# Patient Record
Sex: Male | Born: 1985 | Race: White | Hispanic: No | Marital: Single | Smoking: Never smoker
Health system: Southern US, Community
[De-identification: ages and names within clinical notes are randomized; demographics above are authoritative.]

## PROBLEM LIST (undated history)

## (undated) DIAGNOSIS — M26609 Unspecified temporomandibular joint disorder, unspecified side: Secondary | ICD-10-CM

## (undated) DIAGNOSIS — M5104 Intervertebral disc disorders with myelopathy, thoracic region: Secondary | ICD-10-CM

## (undated) DIAGNOSIS — J301 Allergic rhinitis due to pollen: Secondary | ICD-10-CM

## (undated) DIAGNOSIS — R51 Headache: Secondary | ICD-10-CM

## (undated) DIAGNOSIS — C6290 Malignant neoplasm of unspecified testis, unspecified whether descended or undescended: Secondary | ICD-10-CM

## (undated) HISTORY — PX: RETROPERITONEAL LYMPH NODE EXCISION: SUR551

## (undated) HISTORY — PX: ORCHIECTOMY: SHX2116

## (undated) HISTORY — PX: TONSILLECTOMY: SUR1361

## (undated) HISTORY — DX: Malignant neoplasm of unspecified testis, unspecified whether descended or undescended: C62.90

## (undated) HISTORY — PX: APPENDECTOMY: SHX54

## (undated) HISTORY — DX: Intervertebral disc disorders with myelopathy, thoracic region: M51.04

---

## 2011-02-08 DIAGNOSIS — M5104 Intervertebral disc disorders with myelopathy, thoracic region: Secondary | ICD-10-CM

## 2011-02-08 HISTORY — DX: Intervertebral disc disorders with myelopathy, thoracic region: M51.04

## 2011-05-09 NOTE — Pre-Procedure Instructions (Signed)
22 Matthew Whitaker  05/09/2011   Your procedure is scheduled on:  Monday April 8  Report to [facility] at 8:00 AM.  Call this number if you have problems the morning of surgery: [phone]   Remember:   Do not eat food:After Midnight.  May have clear liquids: up to 4 Hours before arrival.  Clear liquids include soda, tea, black coffee, apple or grape juice, broth.  Take these medicines the morning of surgery with A SIP OF WATER: Norco   Do not wear jewelry, make-up or nail polish.  Do not wear lotions, powders, or perfumes. You may wear deodorant.  Do not shave 48 hours prior to surgery.  Do not bring valuables to the hospital.  Contacts, dentures or bridgework may not be worn into surgery.  Leave suitcase in the car. After surgery it may be brought to your room.  For patients admitted to the hospital, checkout time is 11:00 AM the day of discharge.   Patients discharged the day of surgery will not be allowed to drive home.  Name and phone number of your driver: NA  Special Instructions: CHG Shower Use Special Wash: 1/2 bottle night before surgery and 1/2 bottle morning of surgery.   Please read over the following fact sheets that you were given: Pain Booklet, Coughing and Deep Breathing, Blood Transfusion Information and Surgical Site Infection Prevention

## 2011-05-10 ENCOUNTER — Encounter (HOSPITAL_COMMUNITY): Payer: Self-pay

## 2011-05-10 ENCOUNTER — Encounter (HOSPITAL_COMMUNITY)
Admission: RE | Admit: 2011-05-10 | Discharge: 2011-05-10 | Disposition: A | Discharge status: Home / Self Care | Payer: BC Managed Care – PPO | Source: Ambulatory Visit | Attending: Neurosurgery | Admitting: Neurosurgery

## 2011-05-10 HISTORY — DX: Unspecified temporomandibular joint disorder, unspecified side: M26.609

## 2011-05-10 HISTORY — DX: Allergic rhinitis due to pollen: J30.1

## 2011-05-10 LAB — BASIC METABOLIC PANEL
Calcium: 9.3 mg/dL (ref 8.4–10.5)
Creatinine, Ser: 1.23 mg/dL (ref 0.50–1.35)
GFR calc Af Amer: >90 mL/min (ref >90)
GFR calc non Af Amer: 82 mL/min — ABNORMAL LOW (ref >90)

## 2011-05-10 LAB — DIFFERENTIAL
Basophils Absolute: 0 10*3/uL (ref 0.0–0.1)
Basophils Relative: 0 % (ref 0–1)
Neutro Abs: 2.6 10*3/uL (ref 1.7–7.7)
Neutrophils Relative %: 50 % (ref 43–77)

## 2011-05-10 LAB — TYPE AND SCREEN: Antibody Screen: NEGATIVE

## 2011-05-10 LAB — SURGICAL PCR SCREEN: MRSA, PCR: NEGATIVE

## 2011-05-10 LAB — CBC
Hemoglobin: 15.1 g/dL (ref 13.0–17.0)
MCHC: 35.7 g/dL (ref 30.0–36.0)
RDW: 12.3 % (ref 11.5–15.5)
WBC: 5.1 10*3/uL (ref 4.0–10.5)

## 2011-05-16 ENCOUNTER — Inpatient Hospital Stay (HOSPITAL_COMMUNITY)
Admission: RE | Admit: 2011-05-16 | Discharge: 2011-05-17 | DRG: 758 | Disposition: A | Discharge status: Home / Self Care | Payer: BC Managed Care – PPO | Source: Ambulatory Visit | Attending: Neurosurgery | Admitting: Neurosurgery

## 2011-05-16 ENCOUNTER — Encounter (HOSPITAL_COMMUNITY)
Admission: RE | Disposition: A | Discharge status: Home / Self Care | Payer: Self-pay | Source: Ambulatory Visit | Attending: Neurosurgery

## 2011-05-16 ENCOUNTER — Ambulatory Visit (HOSPITAL_COMMUNITY): Payer: BC Managed Care – PPO | Admitting: Anesthesiology

## 2011-05-16 ENCOUNTER — Ambulatory Visit (HOSPITAL_COMMUNITY): Payer: BC Managed Care – PPO

## 2011-05-16 ENCOUNTER — Encounter (HOSPITAL_COMMUNITY): Payer: Self-pay | Admitting: *Deleted

## 2011-05-16 ENCOUNTER — Encounter (HOSPITAL_COMMUNITY): Payer: Self-pay | Admitting: Neurosurgery

## 2011-05-16 ENCOUNTER — Encounter (HOSPITAL_COMMUNITY): Payer: Self-pay | Admitting: Anesthesiology

## 2011-05-16 DIAGNOSIS — Z8547 Personal history of malignant neoplasm of testis: Secondary | ICD-10-CM

## 2011-05-16 DIAGNOSIS — M5124 Other intervertebral disc displacement, thoracic region: Secondary | ICD-10-CM | POA: Diagnosis present

## 2011-05-16 DIAGNOSIS — Z01812 Encounter for preprocedural laboratory examination: Secondary | ICD-10-CM

## 2011-05-16 DIAGNOSIS — M5104 Intervertebral disc disorders with myelopathy, thoracic region: Principal | ICD-10-CM | POA: Diagnosis present

## 2011-05-16 SURGERY — THORACIC DISCECTOMY
Anesthesia: General | Site: Back | Laterality: Left | Wound class: Clean

## 2011-05-16 MED ORDER — SODIUM CHLORIDE 0.9 % IV SOLN
250.0000 mL | INTRAVENOUS | Status: DC
  Administered 2011-05-16: 250 mL via INTRAVENOUS

## 2011-05-16 MED ORDER — SODIUM CHLORIDE 0.9 % IV SOLN
INTRAVENOUS | Status: AC
  Filled 2011-05-16: qty 500

## 2011-05-16 MED ORDER — ROCURONIUM BROMIDE 100 MG/10ML IV SOLN
INTRAVENOUS | Status: DC | PRN
  Administered 2011-05-16: 5 mg via INTRAVENOUS
  Administered 2011-05-16: 20 mg via INTRAVENOUS
  Administered 2011-05-16 (×2): 5 mg via INTRAVENOUS
  Administered 2011-05-16: 10 mg via INTRAVENOUS
  Administered 2011-05-16 (×2): 5 mg via INTRAVENOUS
  Administered 2011-05-16: 50 mg via INTRAVENOUS
  Administered 2011-05-16 (×2): 5 mg via INTRAVENOUS

## 2011-05-16 MED ORDER — ONDANSETRON HCL 4 MG/2ML IJ SOLN
4.0000 mg | INTRAMUSCULAR | Status: DC | PRN
  Administered 2011-05-16: 4 mg via INTRAVENOUS
  Filled 2011-05-16: qty 2

## 2011-05-16 MED ORDER — HYDROCODONE-ACETAMINOPHEN 5-325 MG PO TABS
1.0000 | ORAL_TABLET | ORAL | Status: DC | PRN
  Administered 2011-05-17 (×2): 2 via ORAL
  Filled 2011-05-16 (×2): qty 2

## 2011-05-16 MED ORDER — HYDROMORPHONE HCL PF 1 MG/ML IJ SOLN
INTRAMUSCULAR | Status: AC
  Filled 2011-05-16: qty 1

## 2011-05-16 MED ORDER — LACTATED RINGERS IV SOLN
INTRAVENOUS | Status: DC | PRN
  Administered 2011-05-16 (×2): via INTRAVENOUS

## 2011-05-16 MED ORDER — BISACODYL 10 MG RE SUPP: 10.0000 mg | Freq: Every day | RECTAL | Status: DC | PRN

## 2011-05-16 MED ORDER — GLYCOPYRROLATE 0.2 MG/ML IJ SOLN
INTRAMUSCULAR | Status: DC | PRN
  Administered 2011-05-16: .8 mg via INTRAVENOUS

## 2011-05-16 MED ORDER — NEOSTIGMINE METHYLSULFATE 1 MG/ML IJ SOLN
INTRAMUSCULAR | Status: DC | PRN
  Administered 2011-05-16: 4 mg via INTRAVENOUS

## 2011-05-16 MED ORDER — VANCOMYCIN HCL 1000 MG IV SOLR
1250.0000 mg | Freq: Two times a day (BID) | INTRAVENOUS | Status: DC
  Administered 2011-05-17: 1250 mg via INTRAVENOUS
  Filled 2011-05-16 (×2): qty 1250

## 2011-05-16 MED ORDER — KETOROLAC TROMETHAMINE 30 MG/ML IJ SOLN
30.0000 mg | Freq: Four times a day (QID) | INTRAMUSCULAR | Status: DC
  Administered 2011-05-16 – 2011-05-17 (×3): 30 mg via INTRAVENOUS
  Filled 2011-05-16 (×7): qty 1

## 2011-05-16 MED ORDER — DEXAMETHASONE SODIUM PHOSPHATE 10 MG/ML IJ SOLN
INTRAMUSCULAR | Status: AC
  Administered 2011-05-16: 10 mg via INTRAVENOUS
  Filled 2011-05-16: qty 1

## 2011-05-16 MED ORDER — VANCOMYCIN HCL 500 MG IV SOLR: 500.0000 mg | INTRAVENOUS | Status: DC

## 2011-05-16 MED ORDER — MENTHOL 3 MG MT LOZG: 1.0000 | LOZENGE | OROMUCOSAL | Status: DC | PRN

## 2011-05-16 MED ORDER — HETASTARCH-ELECTROLYTES 6 % IV SOLN
INTRAVENOUS | Status: DC | PRN
  Administered 2011-05-16: 13:00:00 via INTRAVENOUS

## 2011-05-16 MED ORDER — SENNA 8.6 MG PO TABS
1.0000 | ORAL_TABLET | Freq: Two times a day (BID) | ORAL | Status: DC
  Administered 2011-05-16 – 2011-05-17 (×2): 8.6 mg via ORAL
  Filled 2011-05-16 (×3): qty 1

## 2011-05-16 MED ORDER — ACETAMINOPHEN 650 MG RE SUPP: 650.0000 mg | RECTAL | Status: DC | PRN

## 2011-05-16 MED ORDER — VANCOMYCIN HCL 1000 MG IV SOLR
1500.0000 mg | Freq: Once | INTRAVENOUS | Status: AC
  Administered 2011-05-16: 1500 mg via INTRAVENOUS
  Filled 2011-05-16: qty 1500

## 2011-05-16 MED ORDER — POLYETHYLENE GLYCOL 3350 17 G PO PACK
17.0000 g | PACK | Freq: Every day | ORAL | Status: DC | PRN
  Filled 2011-05-16: qty 1

## 2011-05-16 MED ORDER — MIDAZOLAM HCL 5 MG/5ML IJ SOLN
INTRAMUSCULAR | Status: DC | PRN
  Administered 2011-05-16: 2 mg via INTRAVENOUS

## 2011-05-16 MED ORDER — HYDROMORPHONE HCL PF 1 MG/ML IJ SOLN
0.5000 mg | INTRAMUSCULAR | Status: DC | PRN
Start: 2011-05-16 — End: 2011-05-17
  Administered 2011-05-16 – 2011-05-17 (×5): 1 mg via INTRAVENOUS
  Filled 2011-05-16 (×5): qty 1

## 2011-05-16 MED ORDER — SODIUM CHLORIDE 0.9 % IJ SOLN
3.0000 mL | Freq: Two times a day (BID) | INTRAMUSCULAR | Status: DC
  Administered 2011-05-17: 3 mL via INTRAVENOUS

## 2011-05-16 MED ORDER — DEXAMETHASONE SODIUM PHOSPHATE 10 MG/ML IJ SOLN: 10.0000 mg | Freq: Once | INTRAMUSCULAR | Status: DC

## 2011-05-16 MED ORDER — ZOLPIDEM TARTRATE 5 MG PO TABS: 10.0000 mg | ORAL_TABLET | Freq: Every evening | ORAL | Status: DC | PRN

## 2011-05-16 MED ORDER — PROPOFOL 10 MG/ML IV EMUL
INTRAVENOUS | Status: DC | PRN
  Administered 2011-05-16: 300 mg via INTRAVENOUS

## 2011-05-16 MED ORDER — PHENOL 1.4 % MT LIQD: 1.0000 | OROMUCOSAL | Status: DC | PRN

## 2011-05-16 MED ORDER — SODIUM CHLORIDE 0.9 % IV SOLN
INTRAVENOUS | Status: DC | PRN
  Administered 2011-05-16: 11:00:00 via INTRAVENOUS

## 2011-05-16 MED ORDER — ALUM & MAG HYDROXIDE-SIMETH 200-200-20 MG/5ML PO SUSP: 30.0000 mL | Freq: Four times a day (QID) | ORAL | Status: DC | PRN

## 2011-05-16 MED ORDER — HYDROMORPHONE HCL PF 1 MG/ML IJ SOLN
0.2500 mg | INTRAMUSCULAR | Status: DC | PRN
  Administered 2011-05-16 (×4): 0.5 mg via INTRAVENOUS

## 2011-05-16 MED ORDER — KETOROLAC TROMETHAMINE 30 MG/ML IJ SOLN
INTRAMUSCULAR | Status: DC | PRN
  Administered 2011-05-16: 30 mg via INTRAVENOUS

## 2011-05-16 MED ORDER — PHENYLEPHRINE HCL 10 MG/ML IJ SOLN
INTRAMUSCULAR | Status: DC | PRN
  Administered 2011-05-16: 80 ug via INTRAVENOUS

## 2011-05-16 MED ORDER — OXYCODONE-ACETAMINOPHEN 5-325 MG PO TABS
1.0000 | ORAL_TABLET | ORAL | Status: DC | PRN
  Administered 2011-05-16 – 2011-05-17 (×4): 2 via ORAL
  Filled 2011-05-16 (×4): qty 2

## 2011-05-16 MED ORDER — FLEET ENEMA 7-19 GM/118ML RE ENEM
1.0000 | ENEMA | Freq: Once | RECTAL | Status: AC | PRN
  Filled 2011-05-16: qty 1

## 2011-05-16 MED ORDER — VANCOMYCIN HCL IN DEXTROSE 1-5 GM/200ML-% IV SOLN
1000.0000 mg | Freq: Once | INTRAVENOUS | Status: AC
  Administered 2011-05-16: 1000 mg via INTRAVENOUS
  Filled 2011-05-16: qty 200

## 2011-05-16 MED ORDER — 0.9 % SODIUM CHLORIDE (POUR BTL) OPTIME
TOPICAL | Status: DC | PRN
  Administered 2011-05-16: 1000 mL

## 2011-05-16 MED ORDER — ACETAMINOPHEN 325 MG PO TABS
650.0000 mg | ORAL_TABLET | ORAL | Status: DC | PRN
  Administered 2011-05-17: 650 mg via ORAL
  Filled 2011-05-16: qty 2

## 2011-05-16 MED ORDER — DIAZEPAM 5 MG PO TABS
5.0000 mg | ORAL_TABLET | Freq: Four times a day (QID) | ORAL | Status: DC | PRN
  Administered 2011-05-16: 5 mg via ORAL
  Administered 2011-05-17 (×3): 10 mg via ORAL
  Filled 2011-05-16 (×2): qty 2
  Filled 2011-05-16: qty 1
  Filled 2011-05-16: qty 2

## 2011-05-16 MED ORDER — ONDANSETRON HCL 4 MG/2ML IJ SOLN
INTRAMUSCULAR | Status: DC | PRN
  Administered 2011-05-16: 4 mg via INTRAVENOUS

## 2011-05-16 MED ORDER — FENTANYL CITRATE 0.05 MG/ML IJ SOLN
INTRAMUSCULAR | Status: DC | PRN
  Administered 2011-05-16 (×2): 100 ug via INTRAVENOUS
  Administered 2011-05-16 (×3): 50 ug via INTRAVENOUS
  Administered 2011-05-16: 100 ug via INTRAVENOUS
  Administered 2011-05-16: 50 ug via INTRAVENOUS
  Administered 2011-05-16: 25 ug via INTRAVENOUS
  Administered 2011-05-16 (×3): 50 ug via INTRAVENOUS
  Administered 2011-05-16: 25 ug via INTRAVENOUS
  Administered 2011-05-16: 50 ug via INTRAVENOUS

## 2011-05-16 MED ORDER — SODIUM CHLORIDE 0.9 % IR SOLN
Status: DC | PRN
  Administered 2011-05-16: 11:00:00

## 2011-05-16 MED ORDER — BACITRACIN 50000 UNITS IM SOLR
INTRAMUSCULAR | Status: AC
  Filled 2011-05-16: qty 1

## 2011-05-16 MED ORDER — BUPIVACAINE HCL (PF) 0.25 % IJ SOLN
INTRAMUSCULAR | Status: DC | PRN
  Administered 2011-05-16: 30 mL

## 2011-05-16 MED ORDER — ONDANSETRON HCL 4 MG/2ML IJ SOLN: 4.0000 mg | Freq: Four times a day (QID) | INTRAMUSCULAR | Status: DC | PRN

## 2011-05-16 MED ORDER — THROMBIN 20000 UNITS EX KIT
PACK | CUTANEOUS | Status: DC | PRN
  Administered 2011-05-16: 11:00:00 via TOPICAL

## 2011-05-16 MED ORDER — SODIUM CHLORIDE 0.9 % IJ SOLN: 3.0000 mL | INTRAMUSCULAR | Status: DC | PRN

## 2011-05-16 SURGICAL SUPPLY — 55 items
BAG DECANTER FOR FLEXI CONT (MISCELLANEOUS) ×2 IMPLANT
BENZOIN TINCTURE PRP APPL 2/3 (GAUZE/BANDAGES/DRESSINGS) ×2 IMPLANT
BLADE SURG ROTATE 9660 (MISCELLANEOUS) IMPLANT
BRUSH SCRUB EZ PLAIN DRY (MISCELLANEOUS) ×2 IMPLANT
BUR CUTTER 7.0 ROUND (BURR) ×2 IMPLANT
BUR MATCHSTICK NEURO 3.0 LAGG (BURR) ×2 IMPLANT
CANISTER SUCTION 2500CC (MISCELLANEOUS) ×2 IMPLANT
CLOTH BEACON ORANGE TIMEOUT ST (SAFETY) ×2 IMPLANT
CONT SPEC 4OZ CLIKSEAL STRL BL (MISCELLANEOUS) ×2 IMPLANT
DECANTER SPIKE VIAL GLASS SM (MISCELLANEOUS) ×2 IMPLANT
DERMABOND ADVANCED (GAUZE/BANDAGES/DRESSINGS) ×2
DERMABOND ADVANCED .7 DNX12 (GAUZE/BANDAGES/DRESSINGS) ×2 IMPLANT
DRAPE LAPAROTOMY 100X72X124 (DRAPES) ×2 IMPLANT
DRAPE MICROSCOPE ZEISS OPMI (DRAPES) ×2 IMPLANT
DRAPE POUCH INSTRU U-SHP 10X18 (DRAPES) ×2 IMPLANT
DRAPE PROXIMA HALF (DRAPES) IMPLANT
DRAPE SURG 17X23 STRL (DRAPES) ×8 IMPLANT
ELECT REM PT RETURN 9FT ADLT (ELECTROSURGICAL) ×2
ELECTRODE REM PT RTRN 9FT ADLT (ELECTROSURGICAL) ×1 IMPLANT
EVACUATOR 1/8 PVC DRAIN (DRAIN) ×2 IMPLANT
GAUZE SPONGE 4X4 16PLY XRAY LF (GAUZE/BANDAGES/DRESSINGS) ×2 IMPLANT
GLOVE BIOGEL PI IND STRL 7.0 (GLOVE) ×2 IMPLANT
GLOVE BIOGEL PI IND STRL 7.5 (GLOVE) ×1 IMPLANT
GLOVE BIOGEL PI INDICATOR 7.0 (GLOVE) ×2
GLOVE BIOGEL PI INDICATOR 7.5 (GLOVE) ×1
GLOVE ECLIPSE 7.5 STRL STRAW (GLOVE) ×6 IMPLANT
GLOVE ECLIPSE 8.5 STRL (GLOVE) ×2 IMPLANT
GLOVE EXAM NITRILE LRG STRL (GLOVE) IMPLANT
GLOVE EXAM NITRILE MD LF STRL (GLOVE) ×2 IMPLANT
GLOVE EXAM NITRILE XL STR (GLOVE) IMPLANT
GLOVE EXAM NITRILE XS STR PU (GLOVE) IMPLANT
GLOVE SS BIOGEL STRL SZ 6.5 (GLOVE) ×3 IMPLANT
GLOVE SUPERSENSE BIOGEL SZ 6.5 (GLOVE) ×3
GLOVE SURG SS PI 6.5 STRL IVOR (GLOVE) ×2 IMPLANT
GOWN BRE IMP SLV AUR LG STRL (GOWN DISPOSABLE) ×2 IMPLANT
GOWN BRE IMP SLV AUR XL STRL (GOWN DISPOSABLE) ×6 IMPLANT
GOWN STRL REIN 2XL LVL4 (GOWN DISPOSABLE) IMPLANT
KIT BASIN OR (CUSTOM PROCEDURE TRAY) ×2 IMPLANT
KIT ROOM TURNOVER OR (KITS) ×2 IMPLANT
NEEDLE HYPO 22GX1.5 SAFETY (NEEDLE) ×2 IMPLANT
NEEDLE SPNL 22GX3.5 QUINCKE BK (NEEDLE) ×2 IMPLANT
NS IRRIG 1000ML POUR BTL (IV SOLUTION) ×2 IMPLANT
PACK LAMINECTOMY NEURO (CUSTOM PROCEDURE TRAY) ×2 IMPLANT
PAD ARMBOARD 7.5X6 YLW CONV (MISCELLANEOUS) ×6 IMPLANT
RUBBERBAND STERILE (MISCELLANEOUS) ×4 IMPLANT
SPONGE GAUZE 4X4 12PLY (GAUZE/BANDAGES/DRESSINGS) ×2 IMPLANT
SPONGE SURGIFOAM ABS GEL 100 (HEMOSTASIS) ×2 IMPLANT
SPONGE SURGIFOAM ABS GEL SZ50 (HEMOSTASIS) IMPLANT
STRIP CLOSURE SKIN 1/2X4 (GAUZE/BANDAGES/DRESSINGS) ×4 IMPLANT
SUT VIC AB 2-0 CT1 18 (SUTURE) ×6 IMPLANT
SUT VIC AB 3-0 SH 8-18 (SUTURE) ×4 IMPLANT
SYR 20ML ECCENTRIC (SYRINGE) ×2 IMPLANT
TOWEL OR 17X24 6PK STRL BLUE (TOWEL DISPOSABLE) ×2 IMPLANT
TOWEL OR 17X26 10 PK STRL BLUE (TOWEL DISPOSABLE) ×2 IMPLANT
WATER STERILE IRR 1000ML POUR (IV SOLUTION) ×2 IMPLANT

## 2011-05-16 NOTE — Anesthesia Postprocedure Evaluation (Signed)
Anesthesia Post Note  Patient: Matthew Whitaker  Procedure(s) Performed: Procedure(s) (LRB): THORACIC DISCECTOMY (Left)  Anesthesia type: General  Patient location: PACU  Post pain: Pain level controlled and Adequate analgesia  Post assessment: Post-op Vital signs reviewed, Patient's Cardiovascular Status Stable, Respiratory Function Stable, Patent Airway and Pain level controlled  Last Vitals:  Filed Vitals:   05/16/11 1500  BP:   Pulse: 103  Temp:   Resp: 4    Post vital signs: Reviewed and stable  Level of consciousness: awake, alert  and oriented  Complications: No apparent anesthesia complications

## 2011-05-16 NOTE — Op Note (Signed)
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  Postoperative diagnosis: Same  Procedure Name: Left T2-3 left T5-6 left T6-7 laminotomies and transpedicular microdiscectomy  Surgeon:Lillyan Hitson A.Danaria Larsen, M.D.  Asst. Surgeon: Colon Branch  Anesthesia: General  Indication: 26 year old male with intractable thoracic pain. Workup demonstrates evidence of a left-sided T2-3 left-sided T5-6 and left-sided C6-7 disc herniations with spinal cord compression. Patient presents now for laminotomies and transmitter microdiscectomy is in hopes of improving his symptoms.   Operative note: After induction of anesthesia, patient was positioned prone on the bolsters inappropriately padded. Patient's thoracic region was prepped and draped sterilely. 10 blade was was used to make an incision over with all to be the T6-7 interspace. This carried down sharply in the midline. Supper off Henderson Newcomer performed the left side so the lamina facet joints of T7 and T6. Deep self placed intraoperative x-rays taken and initially this was misinterpreted to be the T6-7 level but on re\re interpretation he was determined this is actually the T7-8 level. Dissection was then redirected 1 level cephalad. Laminotomies then performed is a high-speed drill Kerrison or significant inferior aspect of lamina of T6 the medial aspect of the facet joint at T6-7 and the superior aspect of lamina of T7. Pedicle of T7 was further removed using a high-speed drill. The space and incised 15 blade in her finger fashion. Casimiro Needle was brought field these were my dissection. A large amount of disc herniation was encountered this level. All this disc graciously resected. All loose are obvious he jammed his peers and removed and interspace. At this point a very thorough decompression had been achieved. There is no evidence of injury to the thecal  sac or nerve roots. Wound is then irrigated out solution. Gelfoam was placed topically for hemostasis which Ascent be good. Procedure then repeated in a similar fashion on the left side at T5-6 and on the left-sided T2-3. Following the discectomies at these levels the wounds were irrigated out solution. Gelfoam was placed topically for hemostasis and wounds were closed in a typical fashion using Vicryl sutures. The lower wound had a medium Hemovac drain placed. There were no apparent complications. Patient was well and he returns they're covering postop.

## 2011-05-16 NOTE — Anesthesia Preprocedure Evaluation (Signed)
Anesthesia Evaluation  Patient identified by MRN, date of birth, ID band Patient awake    Reviewed: Allergy & Precautions, H&P , NPO status , Patient's Chart, lab work & pertinent test results  Airway Mallampati: II  Neck ROM: full    Dental   Pulmonary          Cardiovascular     Neuro/Psych    GI/Hepatic   Endo/Other  obese  Renal/GU      Musculoskeletal   Abdominal   Peds  Hematology   Anesthesia Other Findings   Reproductive/Obstetrics                           Anesthesia Physical Anesthesia Plan  ASA: II  Anesthesia Plan: General   Post-op Pain Management:    Induction: Intravenous  Airway Management Planned: Oral ETT  Additional Equipment:   Intra-op Plan:   Post-operative Plan: Extubation in OR  Informed Consent: I have reviewed the patients History and Physical, chart, labs and discussed the procedure including the risks, benefits and alternatives for the proposed anesthesia with the patient or authorized representative who has indicated his/her understanding and acceptance.     Plan Discussed with: CRNA and Surgeon  Anesthesia Plan Comments:         Anesthesia Quick Evaluation  

## 2011-05-16 NOTE — Consult Note (Signed)
ANTIBIOTIC CONSULT NOTE - INITIAL  Pharmacy Consult for Vancomycin Indication: surgical prophylaxis  Allergies  Allergen Reactions  . Augmentin Other (See Comments)    Childhood reaction  . Penicillins Other (See Comments)    Childhood reaction     Patient Measurements: Weight: 134.4kg Height: 196cm  Vital Signs: Temp: 97.9 F (36.6 C) (04/08 1553) Temp src: Oral (04/08 0820) BP: 97/68 mmHg (04/08 1553) Pulse Rate: 120  (04/08 1553) Intake/Output from previous day:   Intake/Output from this shift: Total I/O In: 3700 [I.V.:3200; IV Piggyback:500] Out: 1150 [Urine:400; Blood:750]  Labs: CrCl 140ml/min  Microbiology: Recent Results (from the past 720 hour(s))  SURGICAL PCR SCREEN     Status: Normal   Collection Time   05/10/11 10:26 AM      Component Value Range Status Comment   MRSA, PCR NEGATIVE  NEGATIVE  Final    Staphylococcus aureus NEGATIVE  NEGATIVE  Final    Medical History: Past Medical History  Diagnosis Date  . Back pain, thoracic   . TMJ (temporomandibular joint syndrome)   . Hayfever   . Cancer     hx testicular CA   Medications:  Prescriptions prior to admission  Medication Sig Dispense Refill  . fexofenadine (ALLEGRA) 30 MG tablet Take 30 mg by mouth daily.      Marland Kitchen HYDROcodone-acetaminophen (NORCO) 10-325 MG per tablet Take 1 tablet by mouth every 6 (six) hours as needed. For pain      . ibuprofen (ADVIL,MOTRIN) 200 MG tablet Take 400 mg by mouth every 6 (six) hours as needed. For pain      . tiZANidine (ZANAFLEX) 4 MG capsule Take 2 mg by mouth 2 (two) times daily.       Assessment: 25yo obese male to begin Vancomycin for surgical prophylaxis s/p thoracic discectomy. Patient does have a hemovac drain. Pre-op Vancomycin 1500mg  given at 1030 today. Due to patient's weight, will dose using obesity nomogram.  Goal of Therapy:  Vancomycin trough level 10-15 mcg/ml  Plan:  1) Vancomycin 1000mg  x 1 today (to complete a total of 2500mg   today) 2)Then, Vancomycin 1250mg  q12 3) Follow renal function, troughs as indicated, LOT  Fredrik Rigger 05/16/2011,4:34 PM

## 2011-05-16 NOTE — Transfer of Care (Signed)
Immediate Anesthesia Transfer of Care Note  Patient: Matthew Whitaker  Procedure(s) Performed: Procedure(s) (LRB): THORACIC DISCECTOMY (Left)  Patient Location: PACU  Anesthesia Type: General  Level of Consciousness: awake and alert   Airway & Oxygen Therapy: Patient Spontanous Breathing and Patient connected to face mask oxygen  Post-op Assessment: Report given to PACU RN and Post -op Vital signs reviewed and stable  Post vital signs: Reviewed and stable  Complications: No apparent anesthesia complications

## 2011-05-16 NOTE — Preoperative (Signed)
Beta Blockers   Reason not to administer Beta Blockers:Not Applicable 

## 2011-05-16 NOTE — OR Nursing (Signed)
In and Out catheter done at end of procedure with 400cc dark yellow urine output.

## 2011-05-16 NOTE — Brief Op Note (Signed)
05/16/2011  2:31 PM  PATIENT:  Matthew Whitaker  26 y.o. male  PRE-OPERATIVE DIAGNOSIS:  HNP  POST-OPERATIVE DIAGNOSIS:  * No post-op diagnosis entered *  PROCEDURE:  Procedure(s) (LRB): THORACIC DISCECTOMY (Left)  SURGEON:  Surgeon(s) and Role:    * Temple Pacini, MD - Primary    * Clydene Fake, MD - Assisting  PHYSICIAN ASSISTANT:   ASSISTANTS: Colon Branch ANESTHESIA:   general  EBL:  Total I/O In: 3700 [I.V.:3200; IV Piggyback:500] Out: 1150 [Urine:400; Blood:750]  BLOOD ADMINISTERED:none  DRAINS: (Medium) Hemovact drain(s) in the Epidural space with  Suction Open   LOCAL MEDICATIONS USED:  MARCAINE     SPECIMEN:  No Specimen  DISPOSITION OF SPECIMEN:  N/A  COUNTS:  YES  TOURNIQUET:  * No tourniquets in log *  DICTATION: .Dragon Dictation  PLAN OF CARE: Admit to inpatient   PATIENT DISPOSITION:  PACU - hemodynamically stable.   Delay start of Pharmacological VTE agent (>24hrs) due to surgical blood loss or risk of bleeding: yes

## 2011-05-16 NOTE — H&P (Signed)
Matthew Whitaker is an 26 y.o. male.   Chief Complaint: Thoracic wall pain HPI: 26 year old male with severe left-sided dorsal chest pain with some radiation into his anterior chest wall with some associated numbness and paresthesias with some dysesthesia as well. Patient has no right-sided symptoms. No bowel or bladder dysfunction. No lower trimming function. He has failed all of his conservative management. Workup demonstrates evidence of a significant left-sided T23 disc herniation as well as a left-sided C6-7 disc herniation and to lesser degree left paracentral T5-6 disc herniation. All 3 lesions are felt to be symptomatic at all 3 lesions cause distortion of the thoracic spinal cord. Patient has been casted his options. He said to proceed with three-level thoracic transpedicular microdiscectomy is in hopes of improving his symptoms.  Past Medical History  Diagnosis Date  . Back pain, thoracic   . TMJ (temporomandibular joint syndrome)   . Hayfever   . Cancer     hx testicular CA    Past Surgical History  Procedure Date  . Tonsillectomy   . Orchiectomy     Right  . Retroperitoneal lymph node excision     R    History reviewed. No pertinent family history. Social History:  reports that he has never smoked. He does not have any smokeless tobacco history on file. He reports that he drinks alcohol. He reports that he does not use illicit drugs.  Allergies:  Allergies  Allergen Reactions  . Augmentin Other (See Comments)    Childhood reaction  . Penicillins Other (See Comments)    Childhood reaction     Medications Prior to Admission  Medication Dose Route Frequency Provider Last Rate Last Dose  . bacitracin 16109 UNITS injection           . dexamethasone (DECADRON) 10 MG/ML injection           . dexamethasone (DECADRON) injection 10 mg  10 mg Intravenous Once Temple Pacini, MD      . HYDROmorphone (DILAUDID) injection 0.25-0.5 mg  0.25-0.5 mg Intravenous Q5 min PRN  Raiford Simmonds, MD      . ondansetron (ZOFRAN) injection 4 mg  4 mg Intravenous Q6H PRN Raiford Simmonds, MD      . sodium chloride 0.9 % infusion           . vancomycin (VANCOCIN) 1,500 mg in sodium chloride 0.9 % 500 mL IVPB  1,500 mg Intravenous Once Herby Abraham, PHARMD      . DISCONTD: vancomycin (VANCOCIN) 500 mg in sodium chloride 0.9 % 100 mL IVPB  500 mg Intravenous 60 min Pre-Op Temple Pacini, MD       Medications Prior to Admission  Medication Sig Dispense Refill  . HYDROcodone-acetaminophen (NORCO) 10-325 MG per tablet Take 1 tablet by mouth every 6 (six) hours as needed. For pain      . ibuprofen (ADVIL,MOTRIN) 200 MG tablet Take 400 mg by mouth every 6 (six) hours as needed. For pain        No results found for this or any previous visit (from the past 48 hour(s)). No results found.  Review of Systems  Constitutional: Negative.   HENT: Negative.   Eyes: Negative.   Respiratory: Negative.   Cardiovascular: Negative.   Gastrointestinal: Negative.   Genitourinary: Negative.   Musculoskeletal: Negative.   Skin: Negative.   Neurological: Negative.   Endo/Heme/Allergies: Negative.   Psychiatric/Behavioral: Negative.     Blood pressure 132/83, pulse 89, temperature 98  F (36.7 C), temperature source Oral, resp. rate 20, SpO2 97.00%. Physical Exam  Constitutional: He is oriented to person, place, and time. He appears well-developed and well-nourished. No distress.  HENT:  Head: Normocephalic and atraumatic.  Right Ear: External ear normal.  Left Ear: External ear normal.  Nose: Nose normal.  Mouth/Throat: Oropharynx is clear and moist.  Eyes: Conjunctivae and EOM are normal. Pupils are equal, round, and reactive to light. Right eye exhibits no discharge. Left eye exhibits no discharge.  Neck: Normal range of motion. Neck supple. No tracheal deviation present. No thyromegaly present.  Cardiovascular: Normal rate, regular rhythm and intact distal pulses.     Respiratory: Effort normal and breath sounds normal. No stridor. No respiratory distress. He has no wheezes.  GI: Soft. Bowel sounds are normal. He exhibits no distension. There is no tenderness.  Musculoskeletal: Normal range of motion. He exhibits no edema and no tenderness.  Neurological: He is alert and oriented to person, place, and time. He has normal reflexes. He displays normal reflexes. No cranial nerve deficit. He exhibits normal muscle tone. Coordination normal.  Skin: Skin is warm and dry. No rash noted. He is not diaphoretic. No erythema. No pallor.  Psychiatric: He has a normal mood and affect. His behavior is normal. Judgment and thought content normal.     Assessment/Plan Left T2-3, left T5-6, left T6-7 herniated pulposus with myelopathy. Plan left T2-3, left T5-6, left T6-7 laminotomy and transpedicular microdiscectomy is. Risks and benefits have been explained. Patient wishes to proceed.  Kerron Sedano A 05/16/2011, 10:08 AM

## 2011-05-17 MED ORDER — OXYCODONE-ACETAMINOPHEN 5-325 MG PO TABS: 1.0000 | ORAL_TABLET | ORAL | Status: AC | PRN

## 2011-05-17 NOTE — Discharge Summary (Signed)
Physician Discharge Summary  Patient ID: IRIS TATSCH III MRN: [id number] DOB/AGE: November 10, 1985 25 y.o.  Admit date: 05/16/2011 Discharge date: 05/17/2011  Admission Diagnoses:  Discharge Diagnoses:  Principal Problem:  *Thoracic disc herniation   Discharged Condition: good  Hospital Course: Patient mid-off hospital where he underwent and multilevel thoracic transpedicular microdiscectomy is. Postoperatively 7 well. He is having no radiating pain. Having no lower chamois symptoms. Her sensation are intact to direct testing. Wound is healing well.  Consults:   Significant Diagnostic Studies:   Treatments:   Discharge Exam: Blood pressure 131/75, pulse 96, temperature 98.6 F (37 C), temperature source Oral, resp. rate 18, height 6' 5.17" (1.96 m), weight 134.4 kg (296 lb 4.8 oz), SpO2 96.00%. Awake and alert oriented and appropriate. Cranial nerve function is intact. Motor and sensory function extremities is normal. Wound healing well. Chest abdomen benign.  Disposition: Final discharge disposition not confirmed   Medication List  As of 05/17/2011 11:20 AM   TAKE these medications         fexofenadine 30 MG tablet   Commonly known as: ALLEGRA   Take 30 mg by mouth daily.      HYDROcodone-acetaminophen 10-325 MG per tablet   Commonly known as: NORCO   Take 1 tablet by mouth every 6 (six) hours as needed. For pain      ibuprofen 200 MG tablet   Commonly known as: ADVIL,MOTRIN   Take 400 mg by mouth every 6 (six) hours as needed. For pain      oxyCODONE-acetaminophen 5-325 MG per tablet   Commonly known as: PERCOCET   Take 1-2 tablets by mouth every 4 (four) hours as needed.      tiZANidine 4 MG capsule   Commonly known as: ZANAFLEX   Take 2 mg by mouth 2 (two) times daily.           Follow-up Information    Follow up with Geana Walts A, MD. Call in 1 week.   Contact information:   [address], [address] [place] [address] [phone]          Signed: Temple Pacini 05/17/2011, 11:20 AM

## 2011-05-17 NOTE — Discharge Instructions (Signed)
Wound Care °Keep incision covered and dry for one week.  If you shower prior to then, cover incision with plastic wrap.  °You may remove outer bandage after one week and shower.  °Do not put any creams, lotions, or ointments on incision. °Leave steri-strips on neck.  They will fall off by themselves. °Activity °Walk each and every day, increasing distance each day. °No lifting greater than 5 lbs.  Avoid excessive neck motion. °No driving for 2 weeks; may ride as a passenger locally. °If provided with back brace, wear when out of bed.  It is not necessary to wear brace in bed. °Diet °Resume your normal diet.  °Return to Work °Will be discussed at you follow up appointment. °Call Your Doctor If Any of These Occur °Redness, drainage, or swelling at the wound.  °Temperature greater than 101 degrees. °Severe pain not relieved by pain medication. °Incision starts to come apart. °Follow Up Appt °Call today for appointment in 1-2 weeks ([phone]) or for problems.  If you have any hardware placed in your spine, you will need an x-ray before your appointment. °

## 2011-12-27 ENCOUNTER — Other Ambulatory Visit: Payer: Self-pay | Admitting: Neurosurgery

## 2011-12-27 DIAGNOSIS — M4804 Spinal stenosis, thoracic region: Secondary | ICD-10-CM

## 2012-01-01 ENCOUNTER — Ambulatory Visit
Admission: RE | Admit: 2012-01-01 | Discharge: 2012-01-01 | Disposition: A | Discharge status: Home / Self Care | Payer: BC Managed Care – PPO | Source: Ambulatory Visit | Attending: Neurosurgery | Admitting: Neurosurgery

## 2012-01-01 DIAGNOSIS — M4804 Spinal stenosis, thoracic region: Secondary | ICD-10-CM

## 2012-01-01 MED ORDER — GADOBENATE DIMEGLUMINE 529 MG/ML IV SOLN
20.0000 mL | Freq: Once | INTRAVENOUS | Status: AC | PRN
  Administered 2012-01-01: 20 mL via INTRAVENOUS

## 2012-01-19 ENCOUNTER — Other Ambulatory Visit: Payer: Self-pay | Admitting: Neurosurgery

## 2012-01-20 ENCOUNTER — Encounter (HOSPITAL_COMMUNITY): Payer: Self-pay | Admitting: Pharmacy Technician

## 2012-01-25 ENCOUNTER — Encounter (HOSPITAL_COMMUNITY)
Admission: RE | Admit: 2012-01-25 | Discharge: 2012-01-25 | Disposition: A | Discharge status: Home / Self Care | Payer: BC Managed Care – PPO | Source: Ambulatory Visit | Attending: Neurosurgery | Admitting: Neurosurgery

## 2012-01-25 ENCOUNTER — Encounter (HOSPITAL_COMMUNITY): Payer: Self-pay

## 2012-01-25 HISTORY — DX: Headache: R51

## 2012-01-25 LAB — CBC WITH DIFFERENTIAL/PLATELET
Basophils Absolute: 0 10*3/uL (ref 0.0–0.1)
Basophils Relative: 1 % (ref 0–1)
Eosinophils Absolute: 0.1 10*3/uL (ref 0.0–0.7)
Eosinophils Relative: 1 % (ref 0–5)
Lymphocytes Relative: 36 % (ref 12–46)
MCHC: 34.7 g/dL (ref 30.0–36.0)
MCV: 87.1 fL (ref 78.0–100.0)
Monocytes Absolute: 0.5 10*3/uL (ref 0.1–1.0)
Platelets: 246 10*3/uL (ref 150–400)
RDW: 12.6 % (ref 11.5–15.5)
WBC: 6.6 10*3/uL (ref 4.0–10.5)

## 2012-01-25 LAB — TYPE AND SCREEN: ABO/RH(D): O POS

## 2012-01-25 NOTE — Pre-Procedure Instructions (Signed)
20 Si A Glaus III  01/25/2012   Your procedure is scheduled on:  Friday  January 27, 2012  Report to [facility] at 11:30 AM.  Call this number if you have problems the morning of surgery: [phone]   Remember:   Do not eat food or drink After Midnight.    Take these medicines the morning of surgery with A SIP OF WATER: none   Do not wear jewelry, make-up or nail polish.  Do not wear lotions, powders, or perfumes.  Do not shave 48 hours prior to surgery. Men may shave face and neck.  Do not bring valuables to the hospital.  Contacts, dentures or bridgework may not be worn into surgery.  Leave suitcase in the car. After surgery it may be brought to your room.  For patients admitted to the hospital, checkout time is 11:00 AM the day of discharge.   Patients discharged the day of surgery will not be allowed to drive home.  Name and phone number of your driver: family / friend  Special Instructions: Shower using CHG 2 nights before surgery and the night before surgery.  If you shower the day of surgery use CHG.  Use special wash - you have one bottle of CHG for all showers.  You should use approximately 1/3 of the bottle for each shower.   Please read over the following fact sheets that you were given: Pain Booklet, Coughing and Deep Breathing, Blood Transfusion Information, MRSA Information and Surgical Site Infection Prevention

## 2012-01-26 MED ORDER — VANCOMYCIN HCL 10 G IV SOLR
1500.0000 mg | INTRAVENOUS | Status: AC
  Administered 2012-01-27: 1500 mg via INTRAVENOUS
  Filled 2012-01-26: qty 1500

## 2012-01-27 ENCOUNTER — Ambulatory Visit (HOSPITAL_COMMUNITY): Payer: BC Managed Care – PPO | Admitting: Anesthesiology

## 2012-01-27 ENCOUNTER — Encounter (HOSPITAL_COMMUNITY): Payer: Self-pay | Admitting: Anesthesiology

## 2012-01-27 ENCOUNTER — Ambulatory Visit (HOSPITAL_COMMUNITY)
Admission: RE | Admit: 2012-01-27 | Discharge: 2012-01-28 | Disposition: A | Discharge status: Home / Self Care | Payer: BC Managed Care – PPO | Source: Ambulatory Visit | Attending: Neurosurgery | Admitting: Neurosurgery

## 2012-01-27 ENCOUNTER — Encounter (HOSPITAL_COMMUNITY): Payer: Self-pay | Admitting: *Deleted

## 2012-01-27 ENCOUNTER — Encounter (HOSPITAL_COMMUNITY)
Admission: RE | Disposition: A | Discharge status: Home / Self Care | Payer: Self-pay | Source: Ambulatory Visit | Attending: Neurosurgery

## 2012-01-27 ENCOUNTER — Ambulatory Visit (HOSPITAL_COMMUNITY): Payer: BC Managed Care – PPO

## 2012-01-27 DIAGNOSIS — Z23 Encounter for immunization: Secondary | ICD-10-CM | POA: Insufficient documentation

## 2012-01-27 DIAGNOSIS — Z01812 Encounter for preprocedural laboratory examination: Secondary | ICD-10-CM | POA: Insufficient documentation

## 2012-01-27 DIAGNOSIS — M5104 Intervertebral disc disorders with myelopathy, thoracic region: Secondary | ICD-10-CM | POA: Diagnosis present

## 2012-01-27 HISTORY — PX: THORACIC DISCECTOMY: SHX6113

## 2012-01-27 LAB — BASIC METABOLIC PANEL
BUN: 18 mg/dL (ref 6–23)
CO2: 26 mEq/L (ref 19–32)
Calcium: 9.3 mg/dL (ref 8.4–10.5)
Chloride: 104 mEq/L (ref 96–112)
Creatinine, Ser: 1.09 mg/dL (ref 0.50–1.35)
Glucose, Bld: 91 mg/dL (ref 70–99)

## 2012-01-27 SURGERY — THORACIC DISCECTOMY
Anesthesia: General | Site: Back | Laterality: Right | Wound class: Clean

## 2012-01-27 MED ORDER — OXYCODONE HCL 5 MG/5ML PO SOLN: 5.0000 mg | Freq: Once | ORAL | Status: DC | PRN

## 2012-01-27 MED ORDER — PROMETHAZINE HCL 25 MG/ML IJ SOLN
INTRAMUSCULAR | Status: AC
  Administered 2012-01-27: 6.25 mg via INTRAVENOUS
  Filled 2012-01-27: qty 1

## 2012-01-27 MED ORDER — ACETAMINOPHEN 325 MG PO TABS: 650.0000 mg | ORAL_TABLET | ORAL | Status: DC | PRN

## 2012-01-27 MED ORDER — HYDROMORPHONE HCL PF 1 MG/ML IJ SOLN
INTRAMUSCULAR | Status: AC
  Administered 2012-01-27: 0.5 mg via INTRAVENOUS
  Filled 2012-01-27: qty 1

## 2012-01-27 MED ORDER — GLYCOPYRROLATE 0.2 MG/ML IJ SOLN
INTRAMUSCULAR | Status: DC | PRN
  Administered 2012-01-27: 0.6 mg via INTRAVENOUS

## 2012-01-27 MED ORDER — MIDAZOLAM HCL 5 MG/5ML IJ SOLN
INTRAMUSCULAR | Status: DC | PRN
  Administered 2012-01-27: 2 mg via INTRAVENOUS

## 2012-01-27 MED ORDER — VANCOMYCIN HCL 10 G IV SOLR
1500.0000 mg | Freq: Once | INTRAVENOUS | Status: AC
  Administered 2012-01-28: 1500 mg via INTRAVENOUS
  Filled 2012-01-27: qty 1500

## 2012-01-27 MED ORDER — OXYCODONE HCL 5 MG PO TABS: 5.0000 mg | ORAL_TABLET | Freq: Once | ORAL | Status: DC | PRN

## 2012-01-27 MED ORDER — BACITRACIN 50000 UNITS IM SOLR
INTRAMUSCULAR | Status: AC
  Filled 2012-01-27: qty 1

## 2012-01-27 MED ORDER — SODIUM CHLORIDE 0.9 % IV SOLN
250.0000 mL | INTRAVENOUS | Status: DC
  Administered 2012-01-28: 250 mL via INTRAVENOUS

## 2012-01-27 MED ORDER — SODIUM CHLORIDE 0.9 % IV SOLN
INTRAVENOUS | Status: AC
  Filled 2012-01-27: qty 500

## 2012-01-27 MED ORDER — SODIUM CHLORIDE 0.9 % IJ SOLN
3.0000 mL | Freq: Two times a day (BID) | INTRAMUSCULAR | Status: DC
  Administered 2012-01-27: 3 mL via INTRAVENOUS

## 2012-01-27 MED ORDER — HYDROMORPHONE HCL PF 1 MG/ML IJ SOLN
0.2500 mg | INTRAMUSCULAR | Status: DC | PRN
  Administered 2012-01-27 (×2): 0.5 mg via INTRAVENOUS

## 2012-01-27 MED ORDER — 0.9 % SODIUM CHLORIDE (POUR BTL) OPTIME
TOPICAL | Status: DC | PRN
  Administered 2012-01-27: 1000 mL

## 2012-01-27 MED ORDER — ONDANSETRON HCL 4 MG/2ML IJ SOLN: 4.0000 mg | INTRAMUSCULAR | Status: DC | PRN

## 2012-01-27 MED ORDER — SENNA 8.6 MG PO TABS
1.0000 | ORAL_TABLET | Freq: Two times a day (BID) | ORAL | Status: DC
  Administered 2012-01-27 – 2012-01-28 (×2): 8.6 mg via ORAL
  Filled 2012-01-27 (×3): qty 1

## 2012-01-27 MED ORDER — PROPOFOL 10 MG/ML IV BOLUS
INTRAVENOUS | Status: DC | PRN
  Administered 2012-01-27: 200 mg via INTRAVENOUS

## 2012-01-27 MED ORDER — ACETAMINOPHEN 650 MG RE SUPP: 650.0000 mg | RECTAL | Status: DC | PRN

## 2012-01-27 MED ORDER — LIDOCAINE HCL 4 % MT SOLN
OROMUCOSAL | Status: DC | PRN
  Administered 2012-01-27: 4 mL via TOPICAL

## 2012-01-27 MED ORDER — THROMBIN 5000 UNITS EX KIT
PACK | CUTANEOUS | Status: DC | PRN
  Administered 2012-01-27 (×2): 5000 [IU] via TOPICAL

## 2012-01-27 MED ORDER — ONDANSETRON HCL 4 MG/2ML IJ SOLN
INTRAMUSCULAR | Status: DC | PRN
  Administered 2012-01-27: 4 mg via INTRAVENOUS

## 2012-01-27 MED ORDER — DEXAMETHASONE SODIUM PHOSPHATE 10 MG/ML IJ SOLN
INTRAMUSCULAR | Status: AC
  Filled 2012-01-27: qty 1

## 2012-01-27 MED ORDER — HYDROCODONE-ACETAMINOPHEN 5-325 MG PO TABS: 1.0000 | ORAL_TABLET | ORAL | Status: DC | PRN

## 2012-01-27 MED ORDER — ALUM & MAG HYDROXIDE-SIMETH 200-200-20 MG/5ML PO SUSP: 30.0000 mL | Freq: Four times a day (QID) | ORAL | Status: DC | PRN

## 2012-01-27 MED ORDER — BUPIVACAINE HCL (PF) 0.25 % IJ SOLN
INTRAMUSCULAR | Status: DC | PRN
  Administered 2012-01-27: 30 mL

## 2012-01-27 MED ORDER — NEOSTIGMINE METHYLSULFATE 1 MG/ML IJ SOLN
INTRAMUSCULAR | Status: DC | PRN
  Administered 2012-01-27: 4 mg via INTRAVENOUS

## 2012-01-27 MED ORDER — PHENOL 1.4 % MT LIQD: 1.0000 | OROMUCOSAL | Status: DC | PRN

## 2012-01-27 MED ORDER — ARTIFICIAL TEARS OP OINT
TOPICAL_OINTMENT | OPHTHALMIC | Status: DC | PRN
  Administered 2012-01-27: 1 via OPHTHALMIC

## 2012-01-27 MED ORDER — HYDROMORPHONE HCL PF 1 MG/ML IJ SOLN
0.5000 mg | INTRAMUSCULAR | Status: DC | PRN
  Administered 2012-01-27 – 2012-01-28 (×2): 1 mg via INTRAVENOUS
  Filled 2012-01-27 (×2): qty 1

## 2012-01-27 MED ORDER — KETOROLAC TROMETHAMINE 30 MG/ML IJ SOLN
INTRAMUSCULAR | Status: DC | PRN
  Administered 2012-01-27: 30 mg via INTRAVENOUS

## 2012-01-27 MED ORDER — PHENYLEPHRINE HCL 10 MG/ML IJ SOLN
INTRAMUSCULAR | Status: DC | PRN
  Administered 2012-01-27: 40 ug via INTRAVENOUS
  Administered 2012-01-27: 80 ug via INTRAVENOUS
  Administered 2012-01-27: 40 ug via INTRAVENOUS

## 2012-01-27 MED ORDER — ROCURONIUM BROMIDE 100 MG/10ML IV SOLN
INTRAVENOUS | Status: DC | PRN
  Administered 2012-01-27: 50 mg via INTRAVENOUS
  Administered 2012-01-27 (×2): 10 mg via INTRAVENOUS

## 2012-01-27 MED ORDER — HEMOSTATIC AGENTS (NO CHARGE) OPTIME
TOPICAL | Status: DC | PRN
  Administered 2012-01-27: 1 via TOPICAL

## 2012-01-27 MED ORDER — DEXAMETHASONE SODIUM PHOSPHATE 10 MG/ML IJ SOLN
10.0000 mg | INTRAMUSCULAR | Status: AC
  Administered 2012-01-27: 10 mg via INTRAVENOUS

## 2012-01-27 MED ORDER — HYDROMORPHONE HCL PF 1 MG/ML IJ SOLN
INTRAMUSCULAR | Status: DC | PRN
  Administered 2012-01-27 (×2): 0.5 mg via INTRAVENOUS

## 2012-01-27 MED ORDER — PROMETHAZINE HCL 25 MG/ML IJ SOLN
6.2500 mg | INTRAMUSCULAR | Status: DC | PRN
  Administered 2012-01-27: 6.25 mg via INTRAVENOUS

## 2012-01-27 MED ORDER — KETOROLAC TROMETHAMINE 30 MG/ML IJ SOLN
30.0000 mg | Freq: Four times a day (QID) | INTRAMUSCULAR | Status: DC
  Administered 2012-01-28 (×2): 30 mg via INTRAVENOUS
  Filled 2012-01-27 (×6): qty 1

## 2012-01-27 MED ORDER — SODIUM CHLORIDE 0.9 % IJ SOLN: 3.0000 mL | INTRAMUSCULAR | Status: DC | PRN

## 2012-01-27 MED ORDER — SODIUM CHLORIDE 0.9 % IR SOLN
Status: DC | PRN
  Administered 2012-01-27: 15:00:00

## 2012-01-27 MED ORDER — OXYCODONE-ACETAMINOPHEN 5-325 MG PO TABS
1.0000 | ORAL_TABLET | ORAL | Status: DC | PRN
  Administered 2012-01-27 – 2012-01-28 (×2): 2 via ORAL
  Filled 2012-01-27 (×2): qty 2

## 2012-01-27 MED ORDER — FENTANYL CITRATE 0.05 MG/ML IJ SOLN
INTRAMUSCULAR | Status: DC | PRN
  Administered 2012-01-27: 100 ug via INTRAVENOUS
  Administered 2012-01-27: 150 ug via INTRAVENOUS
  Administered 2012-01-27: 50 ug via INTRAVENOUS
  Administered 2012-01-27: 100 ug via INTRAVENOUS

## 2012-01-27 MED ORDER — DIAZEPAM 5 MG PO TABS
5.0000 mg | ORAL_TABLET | Freq: Four times a day (QID) | ORAL | Status: DC | PRN
  Administered 2012-01-28: 10 mg via ORAL
  Filled 2012-01-27: qty 2

## 2012-01-27 MED ORDER — MENTHOL 3 MG MT LOZG: 1.0000 | LOZENGE | OROMUCOSAL | Status: DC | PRN

## 2012-01-27 MED ORDER — LACTATED RINGERS IV SOLN
INTRAVENOUS | Status: DC | PRN
  Administered 2012-01-27 (×2): via INTRAVENOUS

## 2012-01-27 SURGICAL SUPPLY — 51 items
BAG DECANTER FOR FLEXI CONT (MISCELLANEOUS) ×2 IMPLANT
BENZOIN TINCTURE PRP APPL 2/3 (GAUZE/BANDAGES/DRESSINGS) ×2 IMPLANT
BLADE SURG ROTATE 9660 (MISCELLANEOUS) IMPLANT
BRUSH SCRUB EZ PLAIN DRY (MISCELLANEOUS) ×2 IMPLANT
BUR CUTTER 7.0 ROUND (BURR) ×2 IMPLANT
BUR MATCHSTICK NEURO 3.0 LAGG (BURR) ×2 IMPLANT
CANISTER SUCTION 2500CC (MISCELLANEOUS) ×2 IMPLANT
CLOTH BEACON ORANGE TIMEOUT ST (SAFETY) ×2 IMPLANT
CONT SPEC 4OZ CLIKSEAL STRL BL (MISCELLANEOUS) ×2 IMPLANT
DECANTER SPIKE VIAL GLASS SM (MISCELLANEOUS) ×2 IMPLANT
DERMABOND ADVANCED (GAUZE/BANDAGES/DRESSINGS) ×1
DERMABOND ADVANCED .7 DNX12 (GAUZE/BANDAGES/DRESSINGS) ×1 IMPLANT
DRAPE LAPAROTOMY 100X72X124 (DRAPES) ×2 IMPLANT
DRAPE MICROSCOPE ZEISS OPMI (DRAPES) ×2 IMPLANT
DRAPE POUCH INSTRU U-SHP 10X18 (DRAPES) ×2 IMPLANT
DRAPE PROXIMA HALF (DRAPES) IMPLANT
DRAPE SURG 17X23 STRL (DRAPES) ×4 IMPLANT
ELECT REM PT RETURN 9FT ADLT (ELECTROSURGICAL) ×2
ELECTRODE REM PT RTRN 9FT ADLT (ELECTROSURGICAL) ×1 IMPLANT
GAUZE SPONGE 4X4 16PLY XRAY LF (GAUZE/BANDAGES/DRESSINGS) IMPLANT
GLOVE BIO SURGEON STRL SZ8 (GLOVE) ×2 IMPLANT
GLOVE BIOGEL PI IND STRL 7.5 (GLOVE) ×1 IMPLANT
GLOVE BIOGEL PI INDICATOR 7.5 (GLOVE) ×1
GLOVE ECLIPSE 7.5 STRL STRAW (GLOVE) ×6 IMPLANT
GLOVE ECLIPSE 8.5 STRL (GLOVE) ×2 IMPLANT
GLOVE EXAM NITRILE LRG STRL (GLOVE) IMPLANT
GLOVE EXAM NITRILE MD LF STRL (GLOVE) ×2 IMPLANT
GLOVE EXAM NITRILE XL STR (GLOVE) IMPLANT
GLOVE EXAM NITRILE XS STR PU (GLOVE) IMPLANT
GLOVE INDICATOR 8.5 STRL (GLOVE) ×2 IMPLANT
GOWN BRE IMP SLV AUR LG STRL (GOWN DISPOSABLE) IMPLANT
GOWN BRE IMP SLV AUR XL STRL (GOWN DISPOSABLE) ×6 IMPLANT
GOWN STRL REIN 2XL LVL4 (GOWN DISPOSABLE) IMPLANT
KIT BASIN OR (CUSTOM PROCEDURE TRAY) ×2 IMPLANT
KIT ROOM TURNOVER OR (KITS) ×2 IMPLANT
NEEDLE HYPO 22GX1.5 SAFETY (NEEDLE) ×2 IMPLANT
NEEDLE SPNL 22GX3.5 QUINCKE BK (NEEDLE) ×2 IMPLANT
NS IRRIG 1000ML POUR BTL (IV SOLUTION) ×2 IMPLANT
PACK LAMINECTOMY NEURO (CUSTOM PROCEDURE TRAY) ×2 IMPLANT
PAD ARMBOARD 7.5X6 YLW CONV (MISCELLANEOUS) ×6 IMPLANT
RUBBERBAND STERILE (MISCELLANEOUS) ×4 IMPLANT
SPONGE GAUZE 4X4 12PLY (GAUZE/BANDAGES/DRESSINGS) ×2 IMPLANT
SPONGE SURGIFOAM ABS GEL SZ50 (HEMOSTASIS) ×2 IMPLANT
STRIP CLOSURE SKIN 1/2X4 (GAUZE/BANDAGES/DRESSINGS) ×4 IMPLANT
SUT VIC AB 2-0 CT1 18 (SUTURE) ×4 IMPLANT
SUT VIC AB 3-0 SH 8-18 (SUTURE) ×2 IMPLANT
SYR 20ML ECCENTRIC (SYRINGE) ×2 IMPLANT
TAPE CLOTH SURG 4X10 WHT LF (GAUZE/BANDAGES/DRESSINGS) ×2 IMPLANT
TOWEL OR 17X24 6PK STRL BLUE (TOWEL DISPOSABLE) ×2 IMPLANT
TOWEL OR 17X26 10 PK STRL BLUE (TOWEL DISPOSABLE) ×2 IMPLANT
WATER STERILE IRR 1000ML POUR (IV SOLUTION) ×2 IMPLANT

## 2012-01-27 NOTE — Preoperative (Signed)
Beta Blockers   Reason not to administer Beta Blockers:Not Applicable 

## 2012-01-27 NOTE — Transfer of Care (Signed)
Immediate Anesthesia Transfer of Care Note  Patient: Matthew Whitaker  Procedure(s) Performed: Procedure(s) (LRB) with comments: THORACIC DISCECTOMY (Right) - Thoracic Right Five-Six transpedicular microdiscectomy, Left Thoracic Three-Four, transpedicular microdiscectomy  Patient Location: PACU  Anesthesia Type:General  Level of Consciousness: awake, alert , oriented and patient cooperative  Airway & Oxygen Therapy: Patient Spontanous Breathing and Patient connected to nasal cannula oxygen  Post-op Assessment: Report given to PACU RN, Post -op Vital signs reviewed and stable, Patient moving all extremities and Patient moving all extremities X 4  Post vital signs: Reviewed and stable  Complications: No apparent anesthesia complications

## 2012-01-27 NOTE — Progress Notes (Signed)
ANTIBIOTIC CONSULT NOTE - INITIAL  Pharmacy Consult for Vancomycin Indication: Post surgical prophylaxis  Allergies  Allergen Reactions  . Amoxicillin-Pot Clavulanate Nausea And Vomiting  . Penicillins Nausea And Vomiting         Patient Measurements:  Weight: 136kg  Vital Signs: Temp: 98.1 F (36.7 C) (12/20 1840) Temp src: Oral (12/20 1101) BP: 119/82 mmHg (12/20 1840) Pulse Rate: 102  (12/20 1840) Intake/Output from previous day:   Intake/Output from this shift: Total I/O In: 1300 [I.V.:1300] Out: 200 [Blood:200]  Labs:  [facility] 01/27/12 1144 01/25/12 1538  WBC -- 6.6  HGB -- 14.8  PLT -- 246  LABCREA -- --  CREATININE 1.09 --   The CrCl is unknown because both a height and weight (above a minimum accepted value) are required for this calculation. No results found for this basename: VANCOTROUGH:2,VANCOPEAK:2,VANCORANDOM:2,GENTTROUGH:2,GENTPEAK:2,GENTRANDOM:2,TOBRATROUGH:2,TOBRAPEAK:2,TOBRARND:2,AMIKACINPEAK:2,AMIKACINTROU:2,AMIKACIN:2, in the last 72 hours   Microbiology: Recent Results (from the past 720 hour(s))  SURGICAL PCR SCREEN     Status: Normal   Collection Time   01/25/12  3:38 PM      Component Value Range Status Comment   MRSA, PCR NEGATIVE  NEGATIVE Final    Staphylococcus aureus NEGATIVE  NEGATIVE Final     Medical History: Past Medical History  Diagnosis Date  . Back pain, thoracic   . TMJ (temporomandibular joint syndrome)   . Hayfever   . Cancer     hx testicular CA  . Headache     hx migraines    Medications:  No prescriptions prior to admission   Assessment: 26yom s/p thoracic surgery to receive Vancomycin for post-op surgical prophylaxis. Patient has allergy listed to PCN. No drains placed per Op note - will limit Vancomycin to 1 dose post-op per MD order. Patient received Vancomycin 1.5g preop (~1445).  Wt: 136kg CrCl: >158ml/min  Goal of Therapy:  Vancomycin trough level 10-15 mcg/ml  Plan:  1. Vancomycin 1.5g IV x  1 2. Pharmacy will sign-off. Please reconsult if additional assistance is needed.   Cleon Dew [phone] 01/27/2012,6:56 PM

## 2012-01-27 NOTE — H&P (Addendum)
Matthew Whitaker is an 26 y.o. male.   Chief Complaint: Back pain HPI: 26 year old male status post multiple thoracic disc herniations with subsequent transpedicular microdiscectomy is done approximately 8 months ago. Patient had been doing well until approximately 3 weeks ago when he had the sudden onset of severe mid back pain. He's had some radiating numbness and paresthesias  Gerdy's denies any weakness. He is having no bowel or bladder dysfunction. Workup demonstrates evidence of a new right paracentral disc herniation at T5-6 with significant spinal cord compression. Patient presents now for right-sided transthoracic microdiscectomy at T5-6.  Past Medical History  Diagnosis Date  . Back pain, thoracic   . TMJ (temporomandibular joint syndrome)   . Hayfever   . Cancer     hx testicular CA  . Headache     hx migraines    Past Surgical History  Procedure Date  . Tonsillectomy   . Orchiectomy     Right  . Retroperitoneal lymph node excision     R  . Back surgery   . Appendectomy     History reviewed. No pertinent family history. Social History:  reports that he has never smoked. He does not have any smokeless tobacco history on file. He reports that he drinks alcohol. He reports that he does not use illicit drugs.  Allergies:  Allergies  Allergen Reactions  . Amoxicillin-Pot Clavulanate Nausea And Vomiting  . Penicillins Nausea And Vomiting         No prescriptions prior to admission    Results for orders placed during the hospital encounter of 01/27/12 (from the past 48 hour(s))  BASIC METABOLIC PANEL     Status: Normal   Collection Time   01/27/12 11:44 AM      Component Value Range Comment   Sodium 141  135 - 145 mEq/L    Potassium 4.2  3.5 - 5.1 mEq/L    Chloride 104  96 - 112 mEq/L    CO2 26  19 - 32 mEq/L    Glucose, Bld 91  70 - 99 mg/dL    BUN 18  6 - 23 mg/dL    Creatinine, Ser 2.59  0.50 - 1.35 mg/dL    Calcium 9.3  8.4 - 56.3 mg/dL    GFR  calc non Af Amer >90  >90 mL/min    GFR calc Af Amer >90  >90 mL/min    No results found.  Review of Systems  Constitutional: Negative.   HENT: Negative.   Eyes: Negative.   Respiratory: Negative.   Cardiovascular: Negative.   Gastrointestinal: Negative.   Genitourinary: Negative.   Musculoskeletal: Negative.   Skin: Negative.   Neurological: Negative.   Endo/Heme/Allergies: Negative.   Psychiatric/Behavioral: Negative.     Blood pressure 125/78, pulse 93, temperature 97.4 F (36.3 C), temperature source Oral, resp. rate 20, SpO2 99.00%. Physical Exam  Constitutional: He is oriented to person, place, and time. He appears well-developed and well-nourished. No distress.  HENT:  Head: Normocephalic and atraumatic.  Right Ear: External ear normal.  Left Ear: External ear normal.  Nose: Nose normal.  Mouth/Throat: Oropharynx is clear and moist. No oropharyngeal exudate.  Eyes: Conjunctivae normal and EOM are normal. Pupils are equal, round, and reactive to light. Right eye exhibits no discharge. Left eye exhibits no discharge. No scleral icterus.  Neck: Normal range of motion. Neck supple. No tracheal deviation present. No thyromegaly present.  Cardiovascular: Normal rate, regular rhythm, normal heart sounds and intact distal pulses.  Exam reveals no gallop and no friction rub.   No murmur heard. Respiratory: Effort normal and breath sounds normal. No respiratory distress. He has no wheezes. He has no rales.  GI: Soft. Bowel sounds are normal. He exhibits no distension. There is no tenderness.  Musculoskeletal: Normal range of motion. He exhibits no edema and no tenderness.  Neurological: He is alert and oriented to person, place, and time. He has normal reflexes. No cranial nerve deficit. Coordination normal.  Skin: Skin is warm and dry. No rash noted. He is not diaphoretic. No erythema. No pallor.  Psychiatric: He has a normal mood and affect. His behavior is normal. Judgment and  thought content normal.     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 Plan right thoracic 5/6 transpedicular microdiscectomy. Risks and benefits have been explained. Patient wishes to proceed.   In the process of reviewing the patient's MRI scan prior to surgery I became more impressed of the degree of compressive pathology on the left side at the thoracic 3/4 level as well. I discussed situation with the patient. I recommended that we include a left-sided thoracic 3/4 laminotomy and trans-pedicular microdiscectomy in hopes of giving him the best possible outcome. I discussed the risks and benefits involved with performing additional level with surgery. Patient is aware and wishes to proceed.  Amaka Gluth A 01/27/2012, 2:10 PM

## 2012-01-27 NOTE — Anesthesia Preprocedure Evaluation (Signed)
Anesthesia Evaluation  Patient identified by MRN, date of birth, ID band Patient awake    Reviewed: Allergy & Precautions, H&P , NPO status , Patient's Chart, lab work & pertinent test results  History of Anesthesia Complications Negative for: history of anesthetic complications  Airway Mallampati: II TM Distance: >3 FB Neck ROM: Full    Dental  (+) Teeth Intact and Dental Advisory Given   Pulmonary neg pulmonary ROS,    Pulmonary exam normal       Cardiovascular negative cardio ROS      Neuro/Psych negative psych ROS   GI/Hepatic negative GI ROS, Neg liver ROS,   Endo/Other  negative endocrine ROS  Renal/GU negative Renal ROS     Musculoskeletal   Abdominal   Peds  Hematology negative hematology ROS (+)   Anesthesia Other Findings   Reproductive/Obstetrics                           Anesthesia Physical Anesthesia Plan  ASA: II  Anesthesia Plan: General   Post-op Pain Management:    Induction: Intravenous  Airway Management Planned: Oral ETT  Additional Equipment:   Intra-op Plan:   Post-operative Plan: Extubation in OR  Informed Consent: I have reviewed the patients History and Physical, chart, labs and discussed the procedure including the risks, benefits and alternatives for the proposed anesthesia with the patient or authorized representative who has indicated his/her understanding and acceptance.   Dental advisory given  Plan Discussed with: CRNA, Anesthesiologist and Surgeon  Anesthesia Plan Comments:         Anesthesia Quick Evaluation

## 2012-01-27 NOTE — Brief Op Note (Signed)
01/27/2012  4:53 PM  PATIENT:  Matthew Whitaker  26 y.o. male  PRE-OPERATIVE DIAGNOSIS:  Herniated Nucleus Pulposus  POST-OPERATIVE DIAGNOSIS:  Herniated Nucleus Pulposus  PROCEDURE:  Procedure(s) (LRB) with comments: THORACIC DISCECTOMY (Right) - Thoracic Right Five-Six transpedicular microdiscectomy, Left Thoracic Three-Four, transpedicular microdiscectomy  SURGEON:  Surgeon(s) and Role:    * Temple Pacini, MD - Primary  PHYSICIAN ASSISTANT:   ASSISTANTS: Cram   ANESTHESIA:   general  EBL:  Total I/O In: 1300 [I.V.:1300] Out: 200 [Blood:200]  BLOOD ADMINISTERED:none  DRAINS: none   LOCAL MEDICATIONS USED:  MARCAINE     SPECIMEN:  No Specimen  DISPOSITION OF SPECIMEN:  N/A  COUNTS:  YES  TOURNIQUET:  * No tourniquets in log *  DICTATION: .Dragon Dictation  PLAN OF CARE: Admit for overnight observation  PATIENT DISPOSITION:  PACU - hemodynamically stable.   Delay start of Pharmacological VTE agent (>24hrs) due to surgical blood loss or risk of bleeding: yes

## 2012-01-27 NOTE — Plan of Care (Signed)
Problem: Consults Goal: Diagnosis - Spinal Surgery Outcome: Completed/Met Date Met:  01/27/12 Microdiscectomy

## 2012-01-27 NOTE — Anesthesia Postprocedure Evaluation (Signed)
  Anesthesia Post-op Note  Patient: Matthew Whitaker  Procedure(s) Performed: Procedure(s) (LRB) with comments: THORACIC DISCECTOMY (Right) - Thoracic Right Five-Six transpedicular microdiscectomy, Left Thoracic Three-Four, transpedicular microdiscectomy  Patient Location: PACU  Anesthesia Type:General  Level of Consciousness: awake, alert  and oriented  Airway and Oxygen Therapy: Patient Spontanous Breathing  Post-op Pain: mild  Post-op Assessment: Post-op Vital signs reviewed  Post-op Vital Signs: Reviewed  Complications: No apparent anesthesia complications

## 2012-01-27 NOTE — Op Note (Signed)
Date of procedure: 01/27/2012  Date of dictation: Same  Service: Neurosurgery  Preoperative diagnosis: Left thoracic 3/4 herniated nucleus pulposis with myelopathy  Right paracentral thoracic 5/6 herniated nucleus pulposus with myelopathy  Postoperative diagnosis: Same  Procedure Name: Left thoracic 3/4 laminotomy and transpedicular microdiscectomy  Right thoracic 5/6 laminotomy and transpedicular microdiscectomy    Surgeon:Tanja Gift A.Chozen Latulippe, M.D.  Asst. Surgeon: Wynetta Emery  Anesthesia: General  Indication: 26 year old male with marked mid back pain and symptoms of a thoracic myelopathy. Workup demonstrates evidence of a right paracentral thoracic 5/6 disc herniation with marked spinal cord compression. Patient also found to have a significant left thoracic 3/4 disc herniation with spinal cord compression. He is status post previous T2-3 T6-7 and T7-8 microdiscectomy is. He presents now for T 34 and T5-6 microdiscectomy.  Operative note: After next anesthesia, patient positioned prone on the Wilson frame appropriate padded. The thoracic region prepped and draped. Incision made overlying thoracic 34 level. Supper off Henderson Newcomer performed the left side. Lamina facet joints exposed. X-ray taken level confirmed. Laminotomy performed using high-speed drill and Kerrison rongeurs. Ligament flavum elevated. Underlying thecal sac identified. Medial facet and superior aspect of the pedicle were removed using a high-speed drill. Epidural reflexes coagulated and cut. Microscope used for microdissection. Left lateral disc herniation encountered. This is incised with a 15 blade. Entering the disc space first through the pedicle of the disc space was decompressed and then more superficial disc material was pushed into the cavity and removed. This allowed good discectomy without causing compression upon the thecal sac or spinal cord. All amount of the disc herniation was resected. All loose are obvious he jammed his tear  was removed from the interspace. At that point a very thorough discectomy been achieved. Wound is irrigated out solution. Gelfoam was laid topically for hemostasis and wounds and close in layers with Vicryl sutures. Attention was placed to the T5-6 level. Exposure was obtained in a similar fashion on the right side. Laminotomy and transpedicular approach was also then performed. Microscope was used. The space was again entered through the pedicle of T6 on the right. Disc material was then removed and then superficial disc material was then pushed into the cavity and removed. A large amount of free herniation was encountered and resected. All elements the disc herniation were completely removed. Very good decompression of the thecal sac was achieved. Wound is then copious irrigated. Gelfoam with postoperative hemostasis. Wounds and close in layers with Vicryl sutures. Steri-Strips triggers were applied. No apparent complications. Patient tolerated well and returned to recovery postop.

## 2012-01-28 MED ORDER — CYCLOBENZAPRINE HCL 10 MG PO TABS: 10.0000 mg | ORAL_TABLET | Freq: Three times a day (TID) | ORAL | Status: AC | PRN

## 2012-01-28 MED ORDER — INFLUENZA VIRUS VACC SPLIT PF IM SUSP
0.5000 mL | INTRAMUSCULAR | Status: AC
  Administered 2012-01-28: 0.5 mL via INTRAMUSCULAR
  Filled 2012-01-28 (×2): qty 0.5

## 2012-01-28 MED ORDER — OXYCODONE-ACETAMINOPHEN 5-325 MG PO TABS: 1.0000 | ORAL_TABLET | Freq: Four times a day (QID) | ORAL | Status: AC | PRN

## 2012-01-28 NOTE — Discharge Summary (Signed)
Physician Discharge Summary  Patient ID: Matthew Whitaker MRN: [id number] DOB/AGE: 1985/04/11 26 y.o.  Admit date: 01/27/2012 Discharge date: 01/28/2012  Admission Diagnoses:thoracic hnp with myelopathy  Discharge Diagnoses:  Principal Problem:  *Herniated nucleus pulposus with myelopathy, thoracic   Discharged Condition: good  Hospital Course: Matthew Whitaker is a 26 y.o. male admitted for a herniated thoracic disc at T3/4, and T5/6. Post op he has walked, voided, and tolerated a regular diet. His wound is clean, dry, and without signs of infection.   Consults: None  Significant Diagnostic Studies: none  Treatments: surgery: as above  Discharge Exam: Blood pressure 113/77, pulse 74, temperature 98.4 F (36.9 C), temperature source Oral, resp. rate 20, height 6\' 5"  (1.956 m), weight 135.988 kg (299 lb 12.8 oz), SpO2 94.00%. General appearance: alert, cooperative, appears stated age and no distress Neurologic: Grossly normal  Disposition: 01-Home or Self Care     Medication List     As of 01/28/2012  9:17 AM    TAKE these medications         cyclobenzaprine 10 MG tablet   Commonly known as: FLEXERIL   Take 1 tablet (10 mg total) by mouth 3 (three) times daily as needed for muscle spasms.      oxyCODONE-acetaminophen 5-325 MG per tablet   Commonly known as: PERCOCET/ROXICET   Take 1 tablet by mouth every 6 (six) hours as needed for pain.         Signed: Concepcion Kirkpatrick L 01/28/2012, 9:17 AM

## 2012-01-30 ENCOUNTER — Encounter (HOSPITAL_COMMUNITY): Payer: Self-pay | Admitting: Neurosurgery

## 2014-03-03 IMAGING — CR DG THORACIC SPINE 2V
1 series · 1 of 1 positions shown · non-contrast
Comparison: MRI thoracic spine 01/01/2012.

CLINICAL DATA: T5-6 discectomy.

THORACIC SPINE - 2 VIEW

[view not recorded]
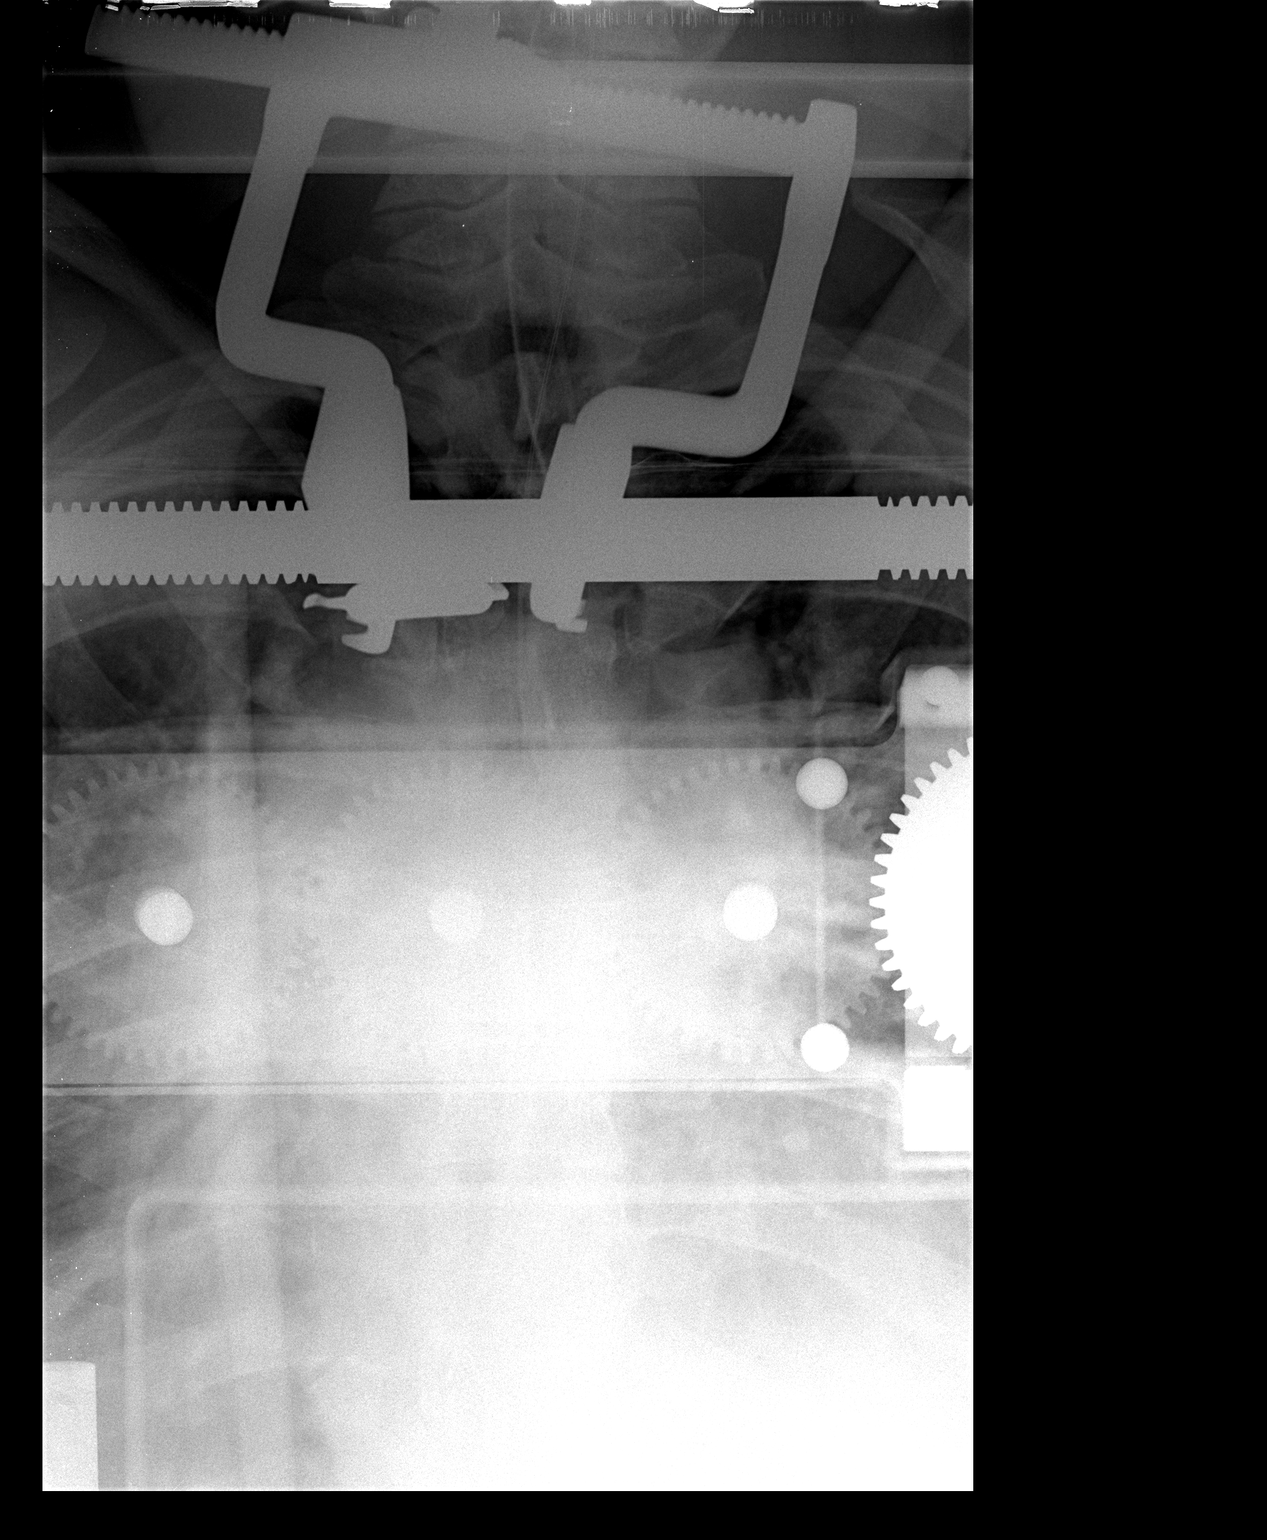

[1 of 1 positions shown; findings below may reference images not displayed]

FINDINGS: We are provided with a single intraoperative view of the
thoracic spine.  Extensive hardware overlies the patient.
Endotracheal tube and NG tube are noted.  No localizer is
identified.
IMPRESSION: T5-6 discectomy in process as described.  No localizing probe is
identified.

## 2016-07-27 ENCOUNTER — Ambulatory Visit: Payer: Self-pay | Admitting: Family Medicine

## 2016-07-27 ENCOUNTER — Encounter: Payer: Self-pay | Admitting: Family Medicine

## 2017-02-23 ENCOUNTER — Other Ambulatory Visit: Payer: Self-pay

## 2017-02-23 ENCOUNTER — Emergency Department (HOSPITAL_COMMUNITY)
Admission: EM | Admit: 2017-02-23 | Discharge: 2017-02-23 | Disposition: A | Discharge status: Home / Self Care | Payer: BLUE CROSS/BLUE SHIELD | Attending: Emergency Medicine | Admitting: Emergency Medicine

## 2017-02-23 DIAGNOSIS — R110 Nausea: Secondary | ICD-10-CM | POA: Insufficient documentation

## 2017-02-23 DIAGNOSIS — R1013 Epigastric pain: Secondary | ICD-10-CM | POA: Diagnosis present

## 2017-02-23 DIAGNOSIS — K2100 Gastro-esophageal reflux disease with esophagitis, without bleeding: Secondary | ICD-10-CM

## 2017-02-23 DIAGNOSIS — Z8547 Personal history of malignant neoplasm of testis: Secondary | ICD-10-CM | POA: Diagnosis not present

## 2017-02-23 LAB — COMPREHENSIVE METABOLIC PANEL
ALBUMIN: 4.1 g/dL (ref 3.5–5.0)
ALT: 23 U/L (ref 17–63)
AST: 20 U/L (ref 15–41)
Alkaline Phosphatase: 80 U/L (ref 38–126)
Anion gap: 5 (ref 5–15)
BILIRUBIN TOTAL: 0.4 mg/dL (ref 0.3–1.2)
BUN: 15 mg/dL (ref 6–20)
CALCIUM: 9.3 mg/dL (ref 8.9–10.3)
CO2: 27 mmol/L (ref 22–32)
Chloride: 106 mmol/L (ref 101–111)
Creatinine, Ser: 0.99 mg/dL (ref 0.61–1.24)
Glucose, Bld: 85 mg/dL (ref 65–99)
POTASSIUM: 3.8 mmol/L (ref 3.5–5.1)
Sodium: 138 mmol/L (ref 135–145)
TOTAL PROTEIN: 7 g/dL (ref 6.5–8.1)

## 2017-02-23 LAB — CBC
HEMATOCRIT: 42 % (ref 39.0–52.0)
HEMOGLOBIN: 15 g/dL (ref 13.0–17.0)
MCH: 31.8 pg (ref 26.0–34.0)
MCHC: 35.7 g/dL (ref 30.0–36.0)
MCV: 89.2 fL (ref 78.0–100.0)
Platelets: 204 10*3/uL (ref 150–400)
RBC: 4.71 MIL/uL (ref 4.22–5.81)
RDW: 12.5 % (ref 11.5–15.5)
WBC: 5.5 10*3/uL (ref 4.0–10.5)

## 2017-02-23 LAB — URINALYSIS, ROUTINE W REFLEX MICROSCOPIC
Bilirubin Urine: NEGATIVE
Glucose, UA: NEGATIVE mg/dL
Hgb urine dipstick: NEGATIVE
Ketones, ur: NEGATIVE mg/dL
LEUKOCYTES UA: NEGATIVE
NITRITE: NEGATIVE
PH: 8 (ref 5.0–8.0)
Protein, ur: NEGATIVE mg/dL
SPECIFIC GRAVITY, URINE: 1.018 (ref 1.005–1.030)

## 2017-02-23 LAB — LIPASE, BLOOD: Lipase: 34 U/L (ref 11–51)

## 2017-02-23 MED ORDER — SUCRALFATE 1 G PO TABS: 1.0000 g | ORAL_TABLET | Freq: Four times a day (QID) | ORAL | 0 refills | Status: AC

## 2017-02-23 MED ORDER — SUCRALFATE 1 G PO TABS
1.0000 g | ORAL_TABLET | Freq: Once | ORAL | Status: AC
  Administered 2017-02-23: 1 g via ORAL
  Filled 2017-02-23: qty 1

## 2017-02-23 MED ORDER — PANTOPRAZOLE SODIUM 20 MG PO TBEC: 20.0000 mg | DELAYED_RELEASE_TABLET | Freq: Two times a day (BID) | ORAL | 0 refills | Status: AC

## 2017-02-23 MED ORDER — GI COCKTAIL ~~LOC~~
30.0000 mL | Freq: Once | ORAL | Status: AC
  Administered 2017-02-23: 30 mL via ORAL
  Filled 2017-02-23: qty 30

## 2017-02-23 MED ORDER — ONDANSETRON 4 MG PO TBDP
4.0000 mg | ORAL_TABLET | Freq: Once | ORAL | Status: AC | PRN
  Administered 2017-02-23: 4 mg via ORAL
  Filled 2017-02-23: qty 1

## 2017-02-23 NOTE — ED Triage Notes (Signed)
Pt c/o epigastric pain, n/v, and constipation x3 days and was seen at [facility] and instructed to seek ED care if s/s worsened. Pt reports increased abd pain when eating. Pt A+OX4, NAD.

## 2017-02-23 NOTE — ED Provider Notes (Signed)
[facility] DEPT Provider Note   CSN: [id number] Arrival date & time: 02/23/17  0859     History   Chief Complaint Chief Complaint  Patient presents with  . Abdominal Pain    HPI Matthew Whitaker is a 32 y.o. male.  32 year old male presents with several days of epigastric abdominal pain that radiates to back through to his back and flanks that becomes worse after he eats.  He has had nausea but no vomiting.  Denies any urinary symptoms.  No black or bloody stools.  Has been using Zantac with limited relief.  Of prior peptic ulcer disease.      Past Medical History:  Diagnosis Date  . Hayfever   . Headache(784.0)    hx migraines  . HNP (herniated nucleus pulposus with myelopathy), thoracic 2013   surgery Dr. Trenton Gammon  . Testicular cancer ([place])    hx testicular CA  . TMJ (temporomandibular joint syndrome)     Patient Active Problem List   Diagnosis Date Noted  . Herniated nucleus pulposus with myelopathy, thoracic 01/27/2012  . Thoracic disc herniation 05/16/2011    Past Surgical History:  Procedure Laterality Date  . APPENDECTOMY    . ORCHIECTOMY     Right  . RETROPERITONEAL LYMPH NODE EXCISION     R  . THORACIC DISCECTOMY  01/27/2012   Procedure: THORACIC DISCECTOMY;  Surgeon: Charlie Pitter, MD;  Location: [facility];  Service: Neurosurgery;  Laterality: Right;  Thoracic Right Five-Six transpedicular microdiscectomy, Left Thoracic Three-Four, transpedicular microdiscectomy  . TONSILLECTOMY         Home Medications    Prior to Admission medications   Medication Sig Start Date End Date Taking? Authorizing Provider  cyclobenzaprine (FLEXERIL) 10 MG tablet Take 1 tablet (10 mg total) by mouth 3 (three) times daily as needed for muscle spasms. 01/28/12   Ashok Pall, MD  oxyCODONE-acetaminophen (ROXICET) 5-325 MG per tablet Take 1 tablet by mouth every 6 (six) hours as needed for pain. 01/28/12   Ashok Pall, MD     Family History No family history on file.  Social History Social History   Tobacco Use  . Smoking status: Never Smoker  Substance Use Topics  . Alcohol use: Yes    Comment: occasional  . Drug use: No     Allergies   Amoxicillin-pot clavulanate and Penicillins   Review of Systems Review of Systems  All other systems reviewed and are negative.    Physical Exam Updated Vital Signs BP 135/76 (BP Location: Left Arm)   Pulse 88   Temp 97.8 F (36.6 C) (Oral)   Resp 18   SpO2 97%   Physical Exam  Constitutional: He is oriented to person, place, and time. He appears well-developed and well-nourished.  Non-toxic appearance. No distress.  HENT:  Head: Normocephalic and atraumatic.  Eyes: Conjunctivae, EOM and lids are normal. Pupils are equal, round, and reactive to light.  Neck: Normal range of motion. Neck supple. No tracheal deviation present. No thyroid mass present.  Cardiovascular: Normal rate, regular rhythm and normal heart sounds. Exam reveals no gallop.  No murmur heard. Pulmonary/Chest: Effort normal and breath sounds normal. No stridor. No respiratory distress. He has no decreased breath sounds. He has no wheezes. He has no rhonchi. He has no rales.  Abdominal: Soft. Normal appearance and bowel sounds are normal. He exhibits no distension. There is tenderness in the epigastric area. There is no rigidity, no rebound, no guarding and no  CVA tenderness.    Musculoskeletal: Normal range of motion. He exhibits no edema or tenderness.  Neurological: He is alert and oriented to person, place, and time. He has normal strength. No cranial nerve deficit or sensory deficit. GCS eye subscore is 4. GCS verbal subscore is 5. GCS motor subscore is 6.  Skin: Skin is warm and dry. No abrasion and no rash noted.  Psychiatric: He has a normal mood and affect. His speech is normal and behavior is normal.  Nursing note and vitals reviewed.    ED Treatments / Results   Labs (all labs ordered are listed, but only abnormal results are displayed) Labs Reviewed  URINALYSIS, ROUTINE W REFLEX MICROSCOPIC - Abnormal; Notable for the following components:      Result Value   APPearance CLOUDY (*)    All other components within normal limits  LIPASE, BLOOD  COMPREHENSIVE METABOLIC PANEL  CBC    EKG  EKG Interpretation None       Radiology No results found.  Procedures Procedures (including critical care time)  Medications Ordered in ED Medications  gi cocktail (Maalox,Lidocaine,Donnatal) (not administered)  sucralfate (CARAFATE) tablet 1 g (not administered)  ondansetron (ZOFRAN-ODT) disintegrating tablet 4 mg (4 mg Oral Given 02/23/17 0914)     Initial Impression / Assessment and Plan / ED Course  I have reviewed the triage vital signs and the nursing notes.  Pertinent labs & imaging results that were available during my care of the patient were reviewed by me and considered in my medical decision making (see chart for details).     Patient treated for likely GERD and will be placed on a PPI as well as Carafate and return precautions given  Final Clinical Impressions(s) / ED Diagnoses   Final diagnoses:  None    ED Discharge Orders    None       Lacretia Leigh, MD 02/23/17 1431
